# Patient Record
Sex: Male | Born: 1970 | Race: White | Hispanic: No | Marital: Married | Smoking: Current some day smoker
Health system: Southern US, Community
[De-identification: ages and names within clinical notes are randomized; demographics above are authoritative.]

## PROBLEM LIST (undated history)

## (undated) DIAGNOSIS — F4310 Post-traumatic stress disorder, unspecified: Secondary | ICD-10-CM

## (undated) DIAGNOSIS — S069XAA Unspecified intracranial injury with loss of consciousness status unknown, initial encounter: Secondary | ICD-10-CM

## (undated) DIAGNOSIS — I10 Essential (primary) hypertension: Secondary | ICD-10-CM

## (undated) DIAGNOSIS — G43909 Migraine, unspecified, not intractable, without status migrainosus: Secondary | ICD-10-CM

## (undated) DIAGNOSIS — E7800 Pure hypercholesterolemia, unspecified: Secondary | ICD-10-CM

## (undated) HISTORY — PX: CHOLECYSTECTOMY: SHX55

---

## 2006-03-08 ENCOUNTER — Emergency Department: Payer: Self-pay | Admitting: Emergency Medicine

## 2009-03-13 ENCOUNTER — Inpatient Hospital Stay: Payer: Self-pay | Admitting: Surgery

## 2010-11-18 IMAGING — CR DG ABDOMEN 3V
1 series · 5 of 5 positions shown · non-contrast
Comparison: none

REASON FOR EXAM: abd  pain
COMMENTS:

PROCEDURE:     DXR - DXR ABDOMEN 3-WAY (INCL PA CXR)  - March 13, 2009  [DATE]
RESULT:     Soft tissue structures are unremarkable. Gas pattern is
nonspecific. No free air noted. No acute pulmonary disease noted.

[Series 1: view not recorded · 0.17mm/px · 5 of 5 slices shown]
[im 1/5]
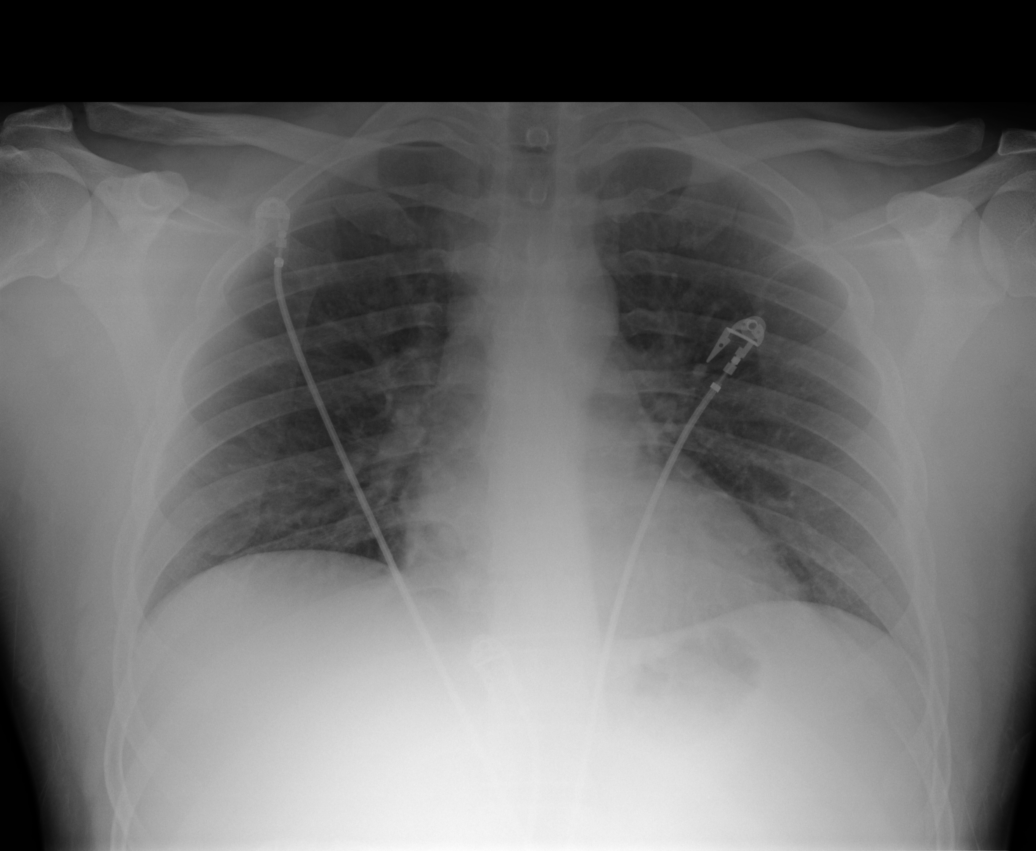
[im 2/5]
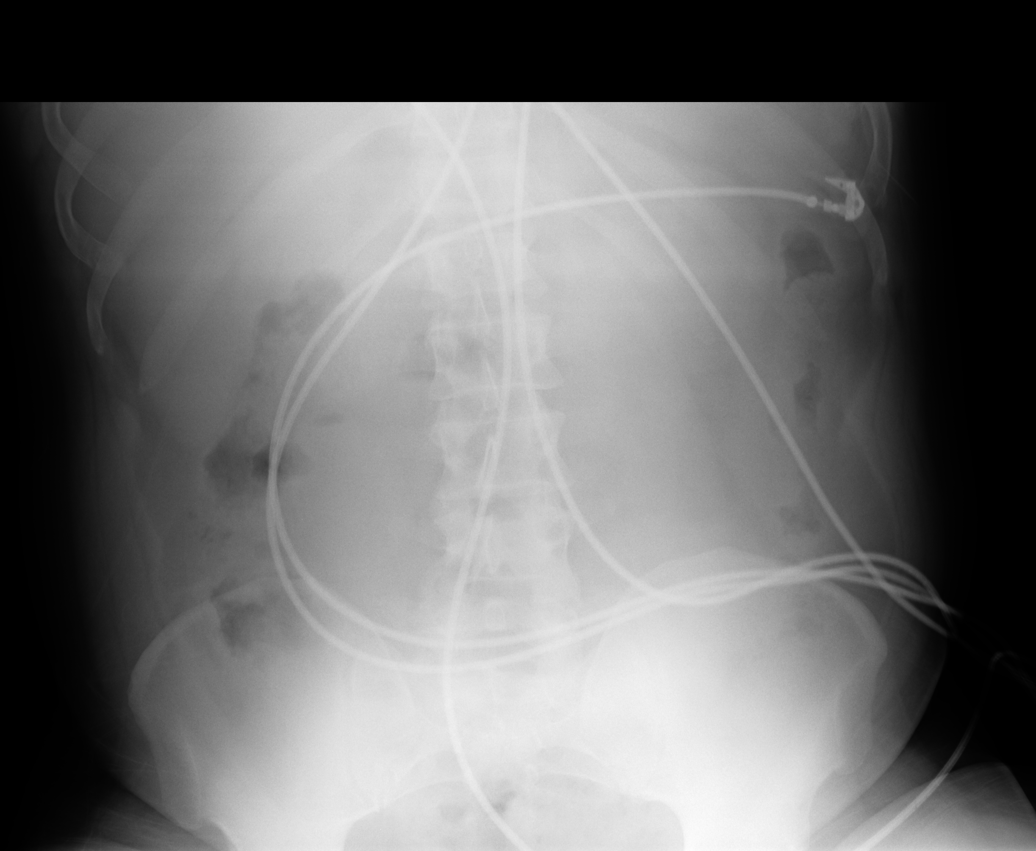
[im 3/5]
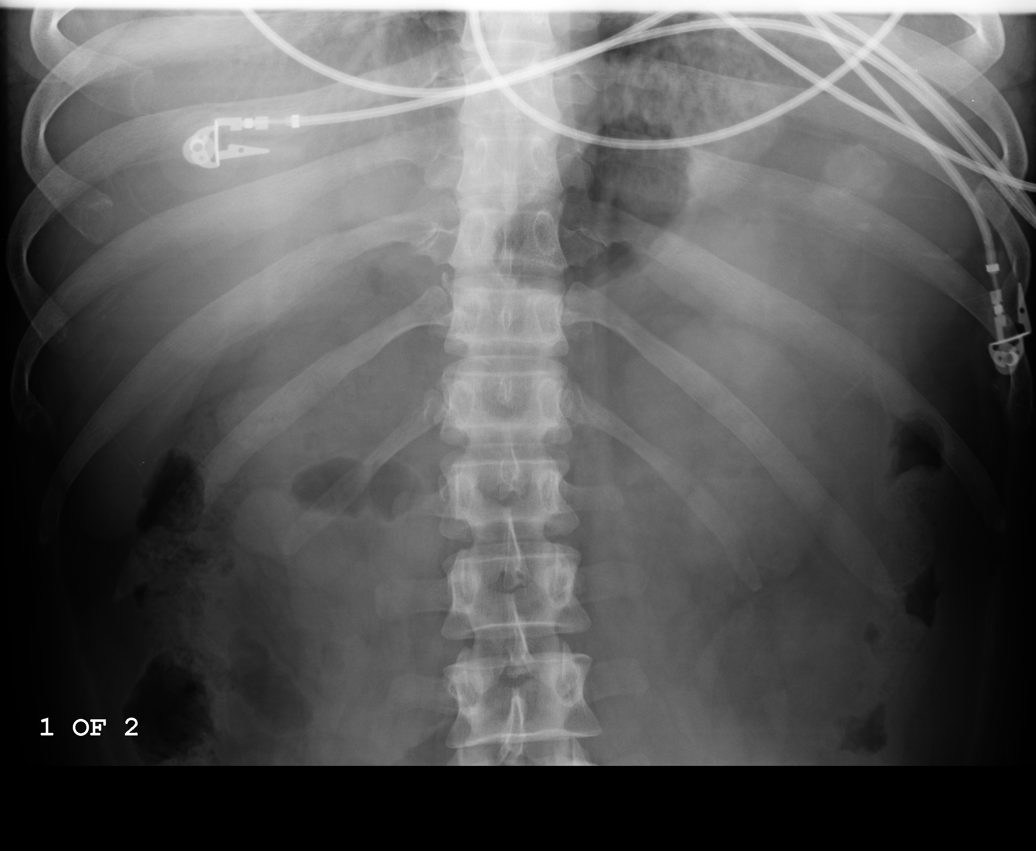
[im 4/5]
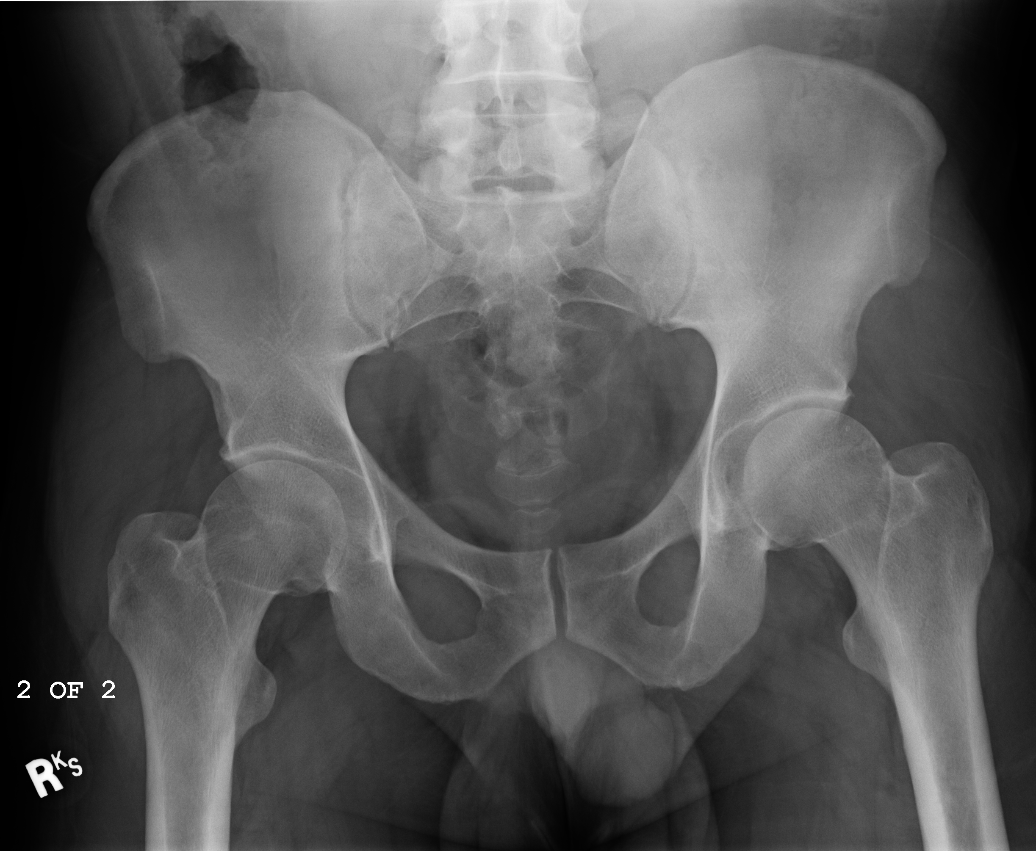
[im 5/5]
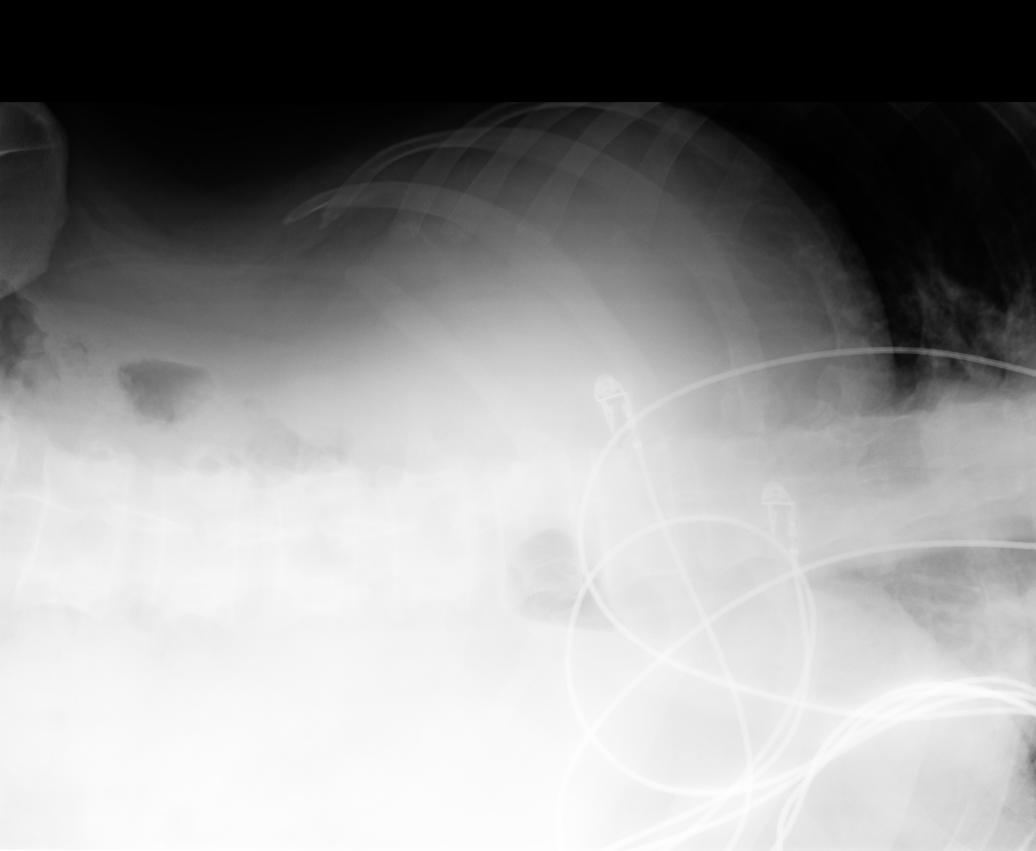

[5 of 5 positions shown; findings below may reference images not displayed]

IMPRESSION: Nonspecific exam.

## 2022-08-30 ENCOUNTER — Other Ambulatory Visit: Payer: Self-pay

## 2022-08-30 DIAGNOSIS — Z3141 Encounter for fertility testing: Secondary | ICD-10-CM

## 2022-09-21 ENCOUNTER — Other Ambulatory Visit: Payer: Self-pay | Admitting: Obstetrics and Gynecology

## 2022-09-25 LAB — SEMEN ANALYSIS, BASIC
Appearance: NORMAL
Concentration, Sperm: 69.4 x10E6/mL
Immotile Sperm: 33 %
Leukocyte Concentration: <1 x10E6/mL
Non-Progressive (NP): 8 %
Normal Morphology-Strict: 15 %
Progressive Motility (PR): 59 %
Progressively Motile Sperm: 57.3 x10E6
Time Collected: 1032
Time Received: 1130
Time Since Last Emission: 30 d
Total Motile Sperm: 65.2 x10E6
Total Motility (PR+NP): 67 %
Total Sperm in Ejaculate: 97.2 x10E6
Volume: 1.4 mL — ABNORMAL LOW
pH: 8

## 2022-10-17 NOTE — Progress Notes (Signed)
This is a normal semen analysis.

## 2024-02-16 ENCOUNTER — Encounter (HOSPITAL_COMMUNITY): Payer: Self-pay | Admitting: Emergency Medicine

## 2024-02-16 ENCOUNTER — Emergency Department (HOSPITAL_COMMUNITY)

## 2024-02-16 ENCOUNTER — Emergency Department (HOSPITAL_COMMUNITY)
Admission: EM | Admit: 2024-02-16 | Discharge: 2024-02-16 | Disposition: A | Discharge status: Home / Self Care | Attending: Emergency Medicine | Admitting: Emergency Medicine

## 2024-02-16 ENCOUNTER — Other Ambulatory Visit: Payer: Self-pay

## 2024-02-16 DIAGNOSIS — H66001 Acute suppurative otitis media without spontaneous rupture of ear drum, right ear: Secondary | ICD-10-CM | POA: Diagnosis not present

## 2024-02-16 DIAGNOSIS — G43809 Other migraine, not intractable, without status migrainosus: Secondary | ICD-10-CM | POA: Insufficient documentation

## 2024-02-16 DIAGNOSIS — R55 Syncope and collapse: Secondary | ICD-10-CM | POA: Insufficient documentation

## 2024-02-16 DIAGNOSIS — I10 Essential (primary) hypertension: Secondary | ICD-10-CM | POA: Diagnosis not present

## 2024-02-16 DIAGNOSIS — R519 Headache, unspecified: Secondary | ICD-10-CM | POA: Diagnosis present

## 2024-02-16 HISTORY — DX: Essential (primary) hypertension: I10

## 2024-02-16 HISTORY — DX: Unspecified intracranial injury with loss of consciousness status unknown, initial encounter: S06.9XAA

## 2024-02-16 HISTORY — DX: Post-traumatic stress disorder, unspecified: F43.10

## 2024-02-16 HISTORY — DX: Migraine, unspecified, not intractable, without status migrainosus: G43.909

## 2024-02-16 HISTORY — DX: Pure hypercholesterolemia, unspecified: E78.00

## 2024-02-16 LAB — COMPREHENSIVE METABOLIC PANEL WITH GFR
ALT: 30 U/L (ref 0–44)
AST: 20 U/L (ref 15–41)
Albumin: 3.6 g/dL (ref 3.5–5.0)
Alkaline Phosphatase: 57 U/L (ref 38–126)
Anion gap: 6 (ref 5–15)
BUN: 8 mg/dL (ref 6–20)
CO2: 27 mmol/L (ref 22–32)
Calcium: 9.2 mg/dL (ref 8.9–10.3)
Chloride: 105 mmol/L (ref 98–111)
Creatinine, Ser: 0.9 mg/dL (ref 0.61–1.24)
GFR, Estimated: >60 mL/min (ref >60)
Glucose, Bld: 97 mg/dL (ref 70–99)
Potassium: 4 mmol/L (ref 3.5–5.1)
Sodium: 138 mmol/L (ref 135–145)
Total Bilirubin: 0.4 mg/dL (ref 0.0–1.2)
Total Protein: 6.7 g/dL (ref 6.5–8.1)

## 2024-02-16 LAB — CBC WITH DIFFERENTIAL/PLATELET
Abs Immature Granulocytes: 0.04 10*3/uL (ref 0.00–0.07)
Basophils Absolute: 0.1 10*3/uL (ref 0.0–0.1)
Basophils Relative: 1 %
Eosinophils Absolute: 0.2 10*3/uL (ref 0.0–0.5)
Eosinophils Relative: 2 %
HCT: 42.9 % (ref 39.0–52.0)
Hemoglobin: 14 g/dL (ref 13.0–17.0)
Immature Granulocytes: 0 %
Lymphocytes Relative: 29 %
Lymphs Abs: 2.7 10*3/uL (ref 0.7–4.0)
MCH: 28.3 pg (ref 26.0–34.0)
MCHC: 32.6 g/dL (ref 30.0–36.0)
MCV: 86.7 fL (ref 80.0–100.0)
Monocytes Absolute: 0.7 10*3/uL (ref 0.1–1.0)
Monocytes Relative: 7 %
Neutro Abs: 5.6 10*3/uL (ref 1.7–7.7)
Neutrophils Relative %: 61 %
Platelets: 236 10*3/uL (ref 150–400)
RBC: 4.95 MIL/uL (ref 4.22–5.81)
RDW: 13.2 % (ref 11.5–15.5)
WBC: 9.3 10*3/uL (ref 4.0–10.5)
nRBC: 0.2 % (ref 0.0–0.2)

## 2024-02-16 LAB — MAGNESIUM: Magnesium: 2 mg/dL (ref 1.7–2.4)

## 2024-02-16 LAB — TROPONIN I (HIGH SENSITIVITY)
Troponin I (High Sensitivity): 7 ng/L (ref <18)
Troponin I (High Sensitivity): 6 ng/L (ref <18)

## 2024-02-16 MED ORDER — SODIUM CHLORIDE 0.9 % IV BOLUS
1000.0000 mL | Freq: Once | INTRAVENOUS | Status: AC
  Administered 2024-02-16: 1000 mL via INTRAVENOUS

## 2024-02-16 MED ORDER — PROCHLORPERAZINE EDISYLATE 10 MG/2ML IJ SOLN
10.0000 mg | Freq: Once | INTRAMUSCULAR | Status: AC
  Administered 2024-02-16: 10 mg via INTRAVENOUS
  Filled 2024-02-16: qty 2

## 2024-02-16 MED ORDER — DIPHENHYDRAMINE HCL 50 MG/ML IJ SOLN
25.0000 mg | Freq: Once | INTRAMUSCULAR | Status: AC
  Administered 2024-02-16: 25 mg via INTRAVENOUS
  Filled 2024-02-16: qty 1

## 2024-02-16 MED ORDER — AMOXICILLIN 875 MG PO TABS: 875.0000 mg | ORAL_TABLET | Freq: Two times a day (BID) | ORAL | 0 refills | Status: AC

## 2024-02-16 MED ORDER — IOHEXOL 350 MG/ML SOLN
75.0000 mL | Freq: Once | INTRAVENOUS | Status: AC | PRN
  Administered 2024-02-16: 75 mL via INTRAVENOUS

## 2024-02-16 NOTE — Discharge Instructions (Addendum)
 You were seen in the emergency department today for concerns of a migraine.  Your labs and imaging were thankfully all reassuring with no explanation of your worsening migraine although you did appear to have good response to your migraine cocktail.  Your CT scan did have findings to suggest possible ear infection and with some pain in this area, I do feel that it would be beneficial to start on antibiotics.  Please take this as prescribed.  For any concerns of new or worsening symptoms, return to the emergency department.

## 2024-02-16 NOTE — ED Triage Notes (Addendum)
 C/o migraine that started on Friday night, with an episode of syncope lasting approx 30 sec, and again today.  Lengthy hx of migraines, [facility] [place]. Has taken meds for migraines - took promethazine and rimegepant last  night-- without relief.   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  A/O x4, w/d, speech clear, no weakness in grips, wife states that he is slurring words some-- C/o pain from neck to around top of head.  Pt is severely claustrophobic-- requests sedation if he needs a CT scan

## 2024-02-16 NOTE — ED Provider Notes (Signed)
 [place] EMERGENCY DEPARTMENT AT [facility] Provider Note   CSN: [id number] Arrival date & time: 02/16/24  1408     History Chief Complaint  Patient presents with  . Migraine    Andrew Hubbard is a 53 y.o. male.  Patient with past history significant for migraines, TBI, PTSD, hypertension and hyperlipidemia presents the ED today with concerns of a migraine headache.  He endorses a history of baseline migraine headaches and has promethazine and rimegepant for home use without significant alleviation of his headache.  Headache now ongoing for the last 2 and half days.  States that he has previously had migraines lasting for greater than a month.  [facility].  Endorses 2 reported syncopal episodes with one occurring today and one 2 days ago on Friday.  The episode today lasted approximately 30 seconds and he was assisted to the floor by his daughter who is at bedside.  Denies any significant cardiac history such as arrhythmias.   Migraine Associated symptoms include headaches.       Home Medications Prior to Admission medications   Not on File      Allergies    Metoprolol    Review of Systems   Review of Systems  Neurological:  Positive for weakness and headaches.  All other systems reviewed and are negative.   Physical Exam Updated Vital Signs BP (!) 168/104 (BP Location: Left Arm)   Pulse 78   Temp 98.5 F (36.9 C) (Oral)   Resp 16   Ht 5\' 10"  (1.778 m)   Wt 115.7 kg   SpO2 99%   BMI 36.59 kg/m  Physical Exam Vitals and nursing note reviewed.  Constitutional:      General: He is not in acute distress.    Appearance: He is well-developed.  HENT:     Head: Normocephalic and atraumatic.  Eyes:     Conjunctiva/sclera: Conjunctivae normal.  Cardiovascular:     Rate and Rhythm: Normal rate and regular rhythm.     Heart sounds: No murmur heard. Pulmonary:     Effort: Pulmonary effort is normal. No respiratory distress.      Breath sounds: Normal breath sounds.  Abdominal:     Palpations: Abdomen is soft.     Tenderness: There is no abdominal tenderness.  Musculoskeletal:        General: No swelling.     Cervical back: Neck supple.  Skin:    General: Skin is warm and dry.     Capillary Refill: Capillary refill takes less than 2 seconds.  Neurological:     General: No focal deficit present.     Mental Status: He is alert and oriented to person, place, and time. Mental status is at baseline.     Cranial Nerves: No cranial nerve deficit.     Sensory: No sensory deficit.     Motor: No weakness.     Comments: 5/5 strength in BLE and BUE. No altered or diminished sensation.   Psychiatric:        Mood and Affect: Mood normal.    ED Results / Procedures / Treatments   Labs (all labs ordered are listed, but only abnormal results are displayed) Labs Reviewed  CBC WITH DIFFERENTIAL/PLATELET  COMPREHENSIVE METABOLIC PANEL WITH GFR  TROPONIN I (HIGH SENSITIVITY)    EKG None  Radiology No results found.  Procedures Procedures    Medications Ordered in ED Medications  sodium chloride 0.9 % bolus 1,000 mL (has  no administration in time range)  diphenhydrAMINE (BENADRYL) injection 25 mg (has no administration in time range)  prochlorperazine (COMPAZINE) injection 10 mg (has no administration in time range)    ED Course/ Medical Decision Making/ A&P                                 Medical Decision Making Amount and/or Complexity of Data Reviewed Labs: ordered. Radiology: ordered.  Risk Prescription drug management.   This patient presents to the ED for concern of migraine.  Differential diagnosis includes cluster headache, migraines, subarachnoid hemorrhage, aneurysm   Lab Tests:  I Ordered, and personally interpreted labs.  The pertinent results include:  ***   Imaging Studies ordered:  I ordered imaging studies including ***  I independently visualized and interpreted imaging  which showed *** I agree with the radiologist interpretation   Medicines ordered and prescription drug management:  I ordered medication including ***  for ***  Reevaluation of the patient after these medicines showed that the patient {resolved/improved/worsened:23923::"improved"} I have reviewed the patients home medicines and have made adjustments as needed   Problem List / ED Course:  ***   Social Determinants of Health:    Final Clinical Impression(s) / ED Diagnoses Final diagnoses:  None    Rx / DC Orders ED Discharge Orders     None

## 2024-02-16 NOTE — ED Notes (Signed)
 Patient transported to CT

## 2024-02-17 NOTE — ED Provider Notes (Incomplete)
 [place] EMERGENCY DEPARTMENT AT [facility] Provider Note   CSN: [id number] Arrival date & time: 02/16/24  1408     History Chief Complaint  Patient presents with  . Migraine    Andrew Hubbard is a 53 y.o. male.  Patient with past history significant for migraines, TBI, PTSD, hypertension and hyperlipidemia presents the ED today with concerns of a migraine headache.  He endorses a history of baseline migraine headaches and has promethazine and rimegepant for home use without significant alleviation of his headache.  Headache now ongoing for the last 2 and half days.  States that he has previously had migraines lasting for greater than a month.  [facility].  Endorses 2 reported syncopal episodes with one occurring today and one 2 days ago on Friday.  The episode today lasted approximately 30 seconds and he was assisted to the floor by his daughter who is at bedside.  Denies any significant cardiac history such as arrhythmias.   Migraine Associated symptoms include headaches.       Home Medications Prior to Admission medications   Not on File      Allergies    Metoprolol    Review of Systems   Review of Systems  Neurological:  Positive for weakness and headaches.  All other systems reviewed and are negative.   Physical Exam Updated Vital Signs BP (!) 168/104 (BP Location: Left Arm)   Pulse 78   Temp 98.5 F (36.9 C) (Oral)   Resp 16   Ht 5\' 10"  (1.778 m)   Wt 115.7 kg   SpO2 99%   BMI 36.59 kg/m  Physical Exam Vitals and nursing note reviewed.  Constitutional:      General: He is not in acute distress.    Appearance: He is well-developed.  HENT:     Head: Normocephalic and atraumatic.  Eyes:     Conjunctiva/sclera: Conjunctivae normal.  Cardiovascular:     Rate and Rhythm: Normal rate and regular rhythm.     Heart sounds: No murmur heard. Pulmonary:     Effort: Pulmonary effort is normal. No respiratory distress.      Breath sounds: Normal breath sounds.  Abdominal:     Palpations: Abdomen is soft.     Tenderness: There is no abdominal tenderness.  Musculoskeletal:        General: No swelling.     Cervical back: Neck supple.  Skin:    General: Skin is warm and dry.     Capillary Refill: Capillary refill takes less than 2 seconds.  Neurological:     General: No focal deficit present.     Mental Status: He is alert and oriented to person, place, and time. Mental status is at baseline.     Cranial Nerves: No cranial nerve deficit.     Sensory: No sensory deficit.     Motor: No weakness.     Comments: 5/5 strength in BLE and BUE. No altered or diminished sensation.   Psychiatric:        Mood and Affect: Mood normal.    ED Results / Procedures / Treatments   Labs (all labs ordered are listed, but only abnormal results are displayed) Labs Reviewed  CBC WITH DIFFERENTIAL/PLATELET  COMPREHENSIVE METABOLIC PANEL WITH GFR  TROPONIN I (HIGH SENSITIVITY)    EKG None  Radiology No results found.  Procedures Procedures    Medications Ordered in ED Medications  sodium chloride 0.9 % bolus 1,000 mL (has  no administration in time range)  diphenhydrAMINE (BENADRYL) injection 25 mg (has no administration in time range)  prochlorperazine (COMPAZINE) injection 10 mg (has no administration in time range)    ED Course/ Medical Decision Making/ A&P                                 Medical Decision Making Amount and/or Complexity of Data Reviewed Labs: ordered. Radiology: ordered.  Risk Prescription drug management.   This patient presents to the ED for concern of migraine.  Differential diagnosis includes cluster headache, migraines, subarachnoid hemorrhage, aneurysm   Lab Tests:  I Ordered, and personally interpreted labs.  The pertinent results include:  ***   Imaging Studies ordered:  I ordered imaging studies including ***  I independently visualized and interpreted imaging  which showed *** I agree with the radiologist interpretation   Medicines ordered and prescription drug management:  I ordered medication including ***  for ***  Reevaluation of the patient after these medicines showed that the patient {resolved/improved/worsened:23923::"improved"} I have reviewed the patients home medicines and have made adjustments as needed   Problem List / ED Course:  ***   Social Determinants of Health:    Final Clinical Impression(s) / ED Diagnoses Final diagnoses:  None    Rx / DC Orders ED Discharge Orders     None
